# Patient Record
Sex: Female | Born: 1975 | Race: White | Hispanic: No | Marital: Married | Smoking: Current every day smoker
Health system: Southern US, Community
[De-identification: ages and names within clinical notes are randomized; demographics above are authoritative.]

## PROBLEM LIST (undated history)

## (undated) DIAGNOSIS — F419 Anxiety disorder, unspecified: Secondary | ICD-10-CM

## (undated) DIAGNOSIS — D649 Anemia, unspecified: Secondary | ICD-10-CM

## (undated) DIAGNOSIS — O2230 Deep phlebothrombosis in pregnancy, unspecified trimester: Secondary | ICD-10-CM

## (undated) DIAGNOSIS — I2510 Atherosclerotic heart disease of native coronary artery without angina pectoris: Secondary | ICD-10-CM

## (undated) DIAGNOSIS — E119 Type 2 diabetes mellitus without complications: Secondary | ICD-10-CM

## (undated) DIAGNOSIS — J45909 Unspecified asthma, uncomplicated: Secondary | ICD-10-CM

## (undated) DIAGNOSIS — D6851 Activated protein C resistance: Secondary | ICD-10-CM

## (undated) DIAGNOSIS — I10 Essential (primary) hypertension: Secondary | ICD-10-CM

## (undated) HISTORY — DX: Essential (primary) hypertension: I10

## (undated) HISTORY — DX: Atherosclerotic heart disease of native coronary artery without angina pectoris: I25.10

## (undated) HISTORY — DX: Anxiety disorder, unspecified: F41.9

## (undated) HISTORY — DX: Type 2 diabetes mellitus without complications: E11.9

## (undated) HISTORY — DX: Anemia, unspecified: D64.9

---

## 2008-01-09 ENCOUNTER — Ambulatory Visit: Payer: Self-pay | Admitting: Cardiology

## 2008-01-09 ENCOUNTER — Inpatient Hospital Stay (HOSPITAL_COMMUNITY): Admission: EM | Admit: 2008-01-09 | Discharge: 2008-02-04 | Payer: Self-pay | Admitting: Internal Medicine

## 2008-01-09 ENCOUNTER — Ambulatory Visit: Payer: Self-pay | Admitting: Pulmonary Disease

## 2008-01-17 ENCOUNTER — Ambulatory Visit: Payer: Self-pay | Admitting: Thoracic Surgery (Cardiothoracic Vascular Surgery)

## 2008-01-20 ENCOUNTER — Encounter (INDEPENDENT_AMBULATORY_CARE_PROVIDER_SITE_OTHER): Payer: Self-pay | Admitting: Pulmonary Disease

## 2008-02-18 ENCOUNTER — Ambulatory Visit: Payer: Self-pay | Admitting: Internal Medicine

## 2008-02-18 DIAGNOSIS — J13 Pneumonia due to Streptococcus pneumoniae: Secondary | ICD-10-CM | POA: Insufficient documentation

## 2008-02-18 DIAGNOSIS — D649 Anemia, unspecified: Secondary | ICD-10-CM

## 2008-02-18 LAB — CONVERTED CEMR LAB
Basophils Absolute: 0.1 10*3/uL (ref 0.0–0.1)
Eosinophils Absolute: 0.1 10*3/uL (ref 0.0–0.6)
Eosinophils Relative: 1.4 % (ref 0.0–5.0)
MCV: 95.3 fL (ref 78.0–100.0)
Platelets: 560 10*3/uL — ABNORMAL HIGH (ref 150–400)
RBC: 3.83 M/uL — ABNORMAL LOW (ref 3.87–5.11)
RDW: 16.4 % — ABNORMAL HIGH (ref 11.5–14.6)
WBC: 10.4 10*3/uL (ref 4.5–10.5)

## 2008-03-07 ENCOUNTER — Telehealth: Payer: Self-pay | Admitting: Internal Medicine

## 2008-03-09 ENCOUNTER — Ambulatory Visit: Payer: Self-pay | Admitting: Pulmonary Disease

## 2008-03-09 ENCOUNTER — Inpatient Hospital Stay (HOSPITAL_COMMUNITY): Admission: AD | Admit: 2008-03-09 | Discharge: 2008-03-16 | Payer: Self-pay | Admitting: Critical Care Medicine

## 2008-03-10 ENCOUNTER — Ambulatory Visit: Payer: Self-pay | Admitting: Surgery

## 2008-03-10 ENCOUNTER — Encounter: Payer: Self-pay | Admitting: Emergency Medicine

## 2008-04-07 ENCOUNTER — Telehealth (INDEPENDENT_AMBULATORY_CARE_PROVIDER_SITE_OTHER): Payer: Self-pay | Admitting: *Deleted

## 2009-07-28 IMAGING — CT CT CHEST W/O CM
2 of 3 series · 15 of 36 positions shown, 18 images · IV contrast (APPLIED)
Comparison: Chest x-ray of 01/14/08.

CLINICAL DATA: 31-year-old with respiratory failure. 
 CHEST CT WITHOUT CONTRAST:
TECHNIQUE: Multidetector CT imaging of the chest was performed following the standard protocol without IV contrast.

[Series 2: routine chest 5.0 st · axial · 0.59mm/px · z∈[-278,-64]mm · 12 of 51 slices shown, 15 images]
[im 4/51  mediastinal]
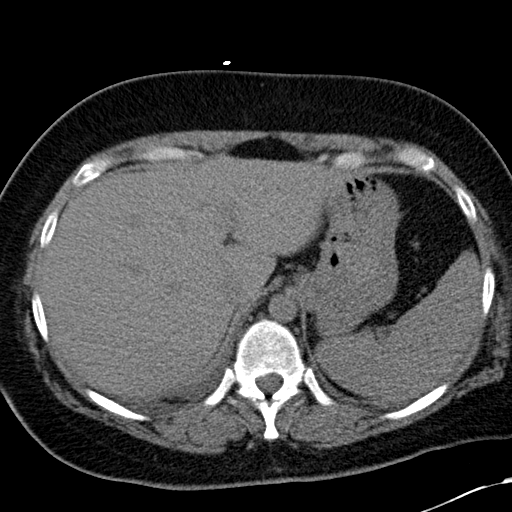
[im 4/51  lung]
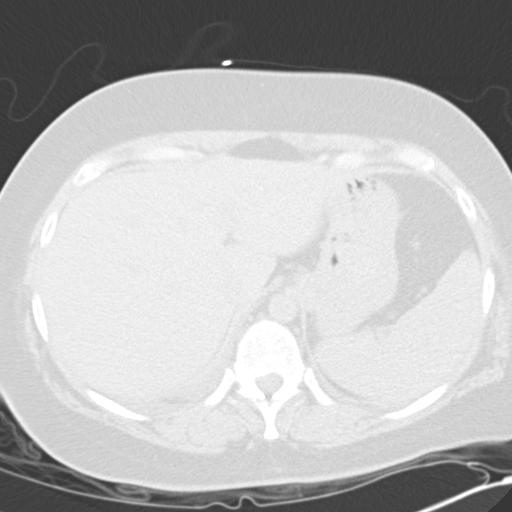
[im 8/51  lung]
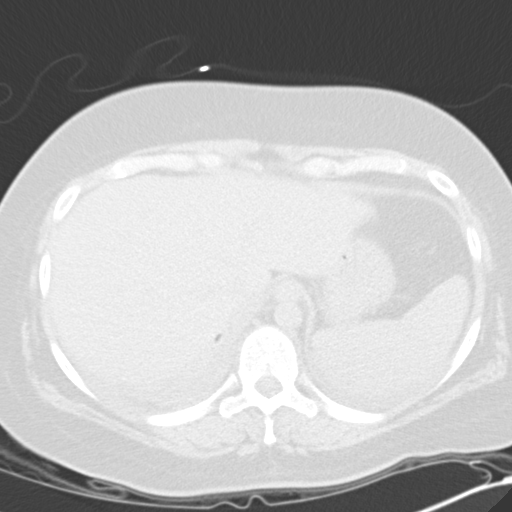
[im 12/51  lung]
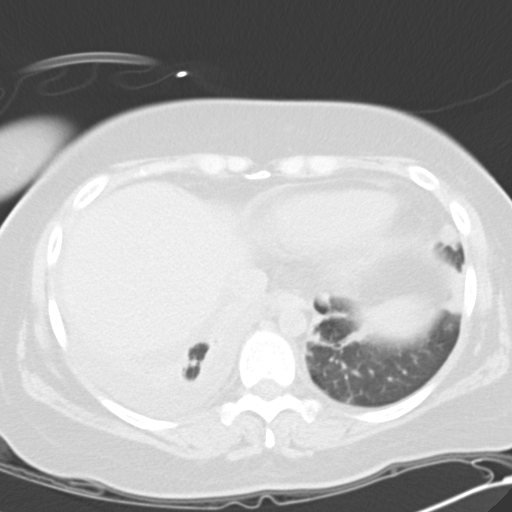
[im 15/51  lung]
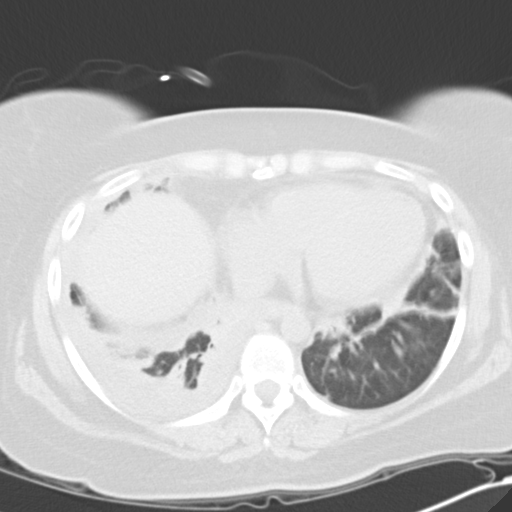
[im 19/51  mediastinal]
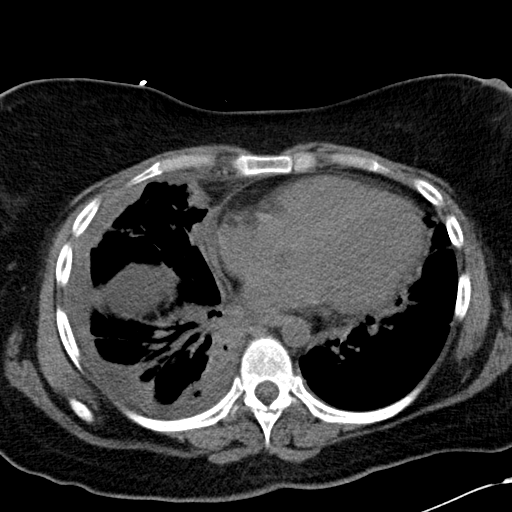
[im 19/51  lung]
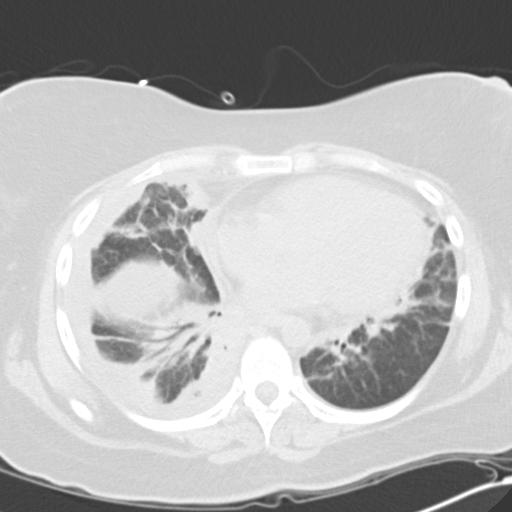
[im 23/51  lung]
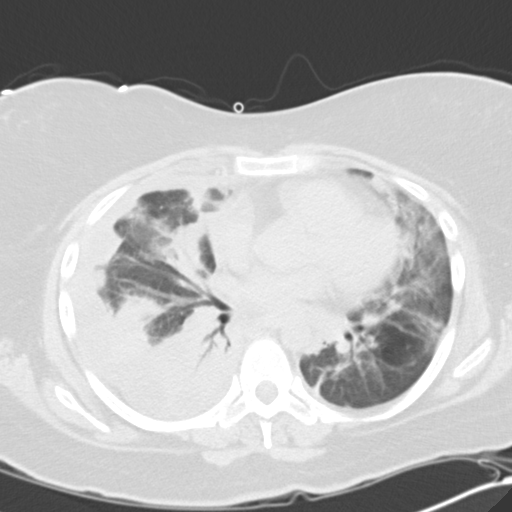
[im 28/51  lung]
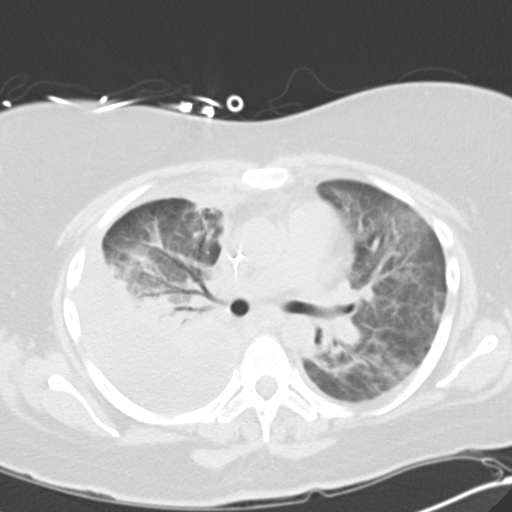
[im 32/51  lung]
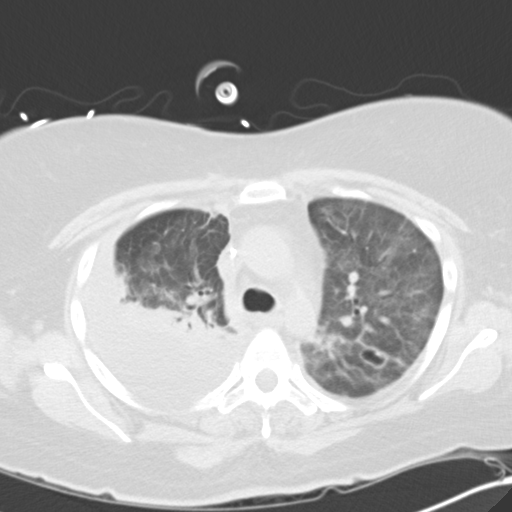
[im 36/51  mediastinal]
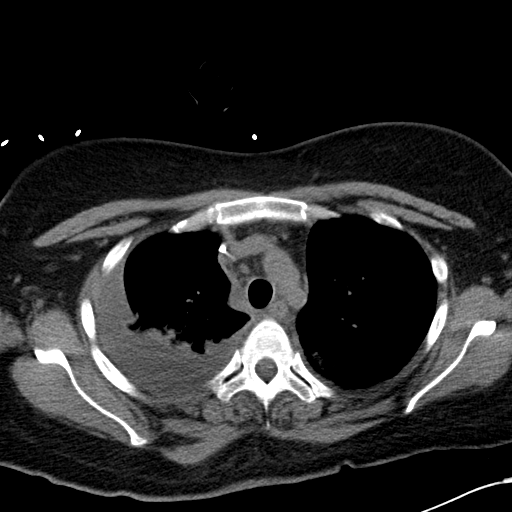
[im 36/51  lung]
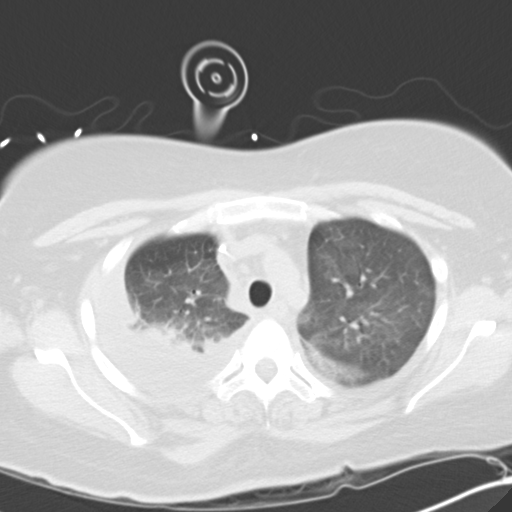
[im 39/51  lung]
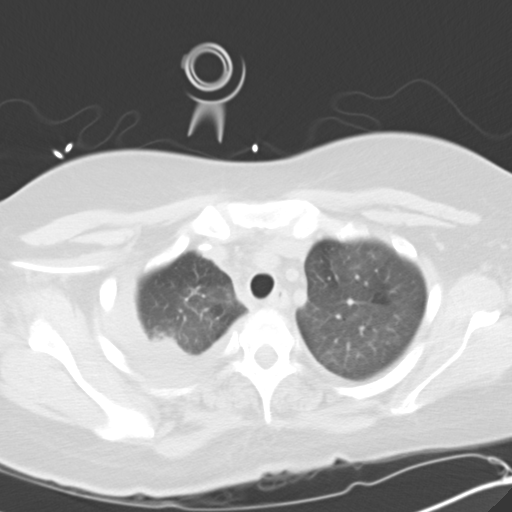
[im 43/51  lung]
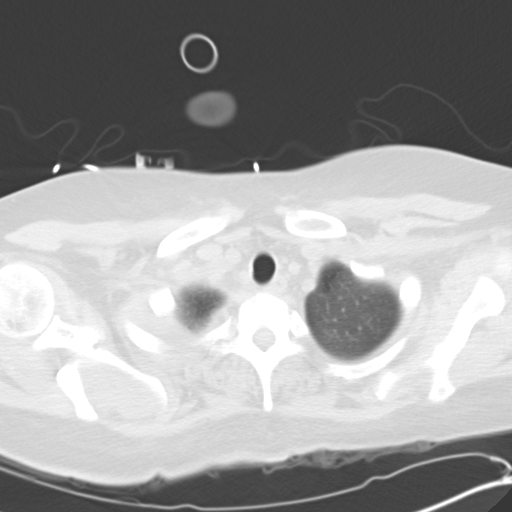
[im 47/51  lung]
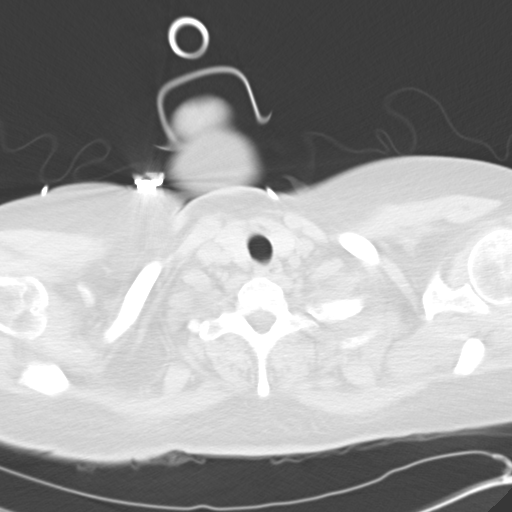

[Series 4: routine chest 2.0 st · coronal · 0.60mm/px · 3 of 101 slices shown]
[im 21/101  lung]
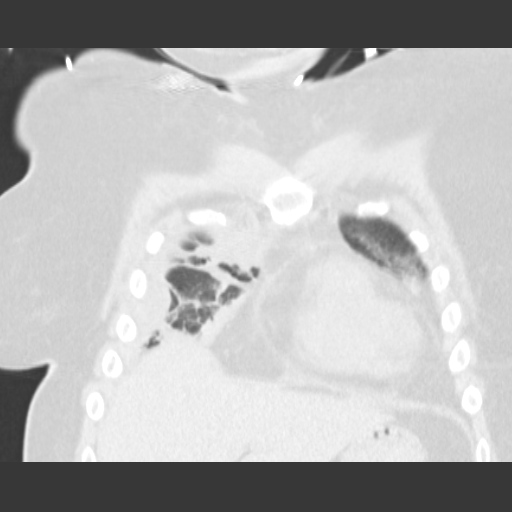
[im 41/101  lung]
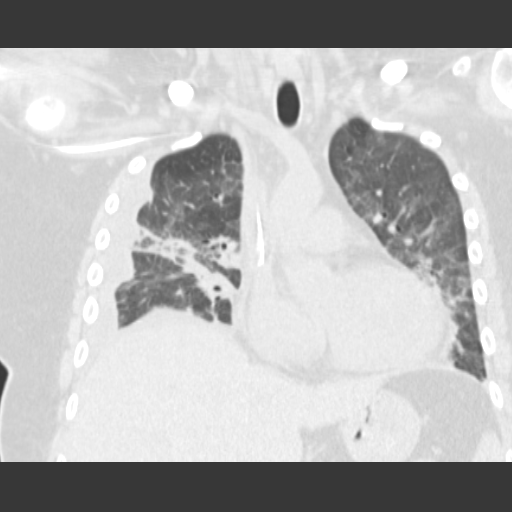
[im 61/101  lung]
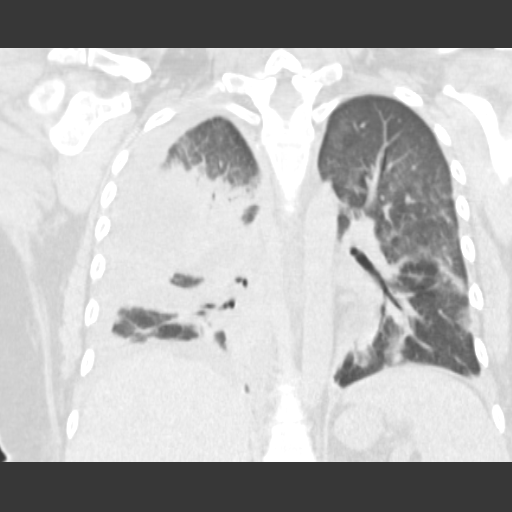

[15 of 36 positions shown; findings below may reference images not displayed]

FINDINGS: The chest wall, soft tissues and bony structures are grossly normal.  No obvious breast masses, supraclavicular or axillary adenopathy.  
 The heart is mildly enlarged.  No pericardial effusion.  Mediastinal and hilar lymph nodes are without adenopathy.  There are right middle and right lower lobe infiltrates with fairly dense airspace consolidation and prominent air bronchograms.  There is also loculated pleural fluid on the right, along with some fluid in the major fissure.  
 There is a small cavitary lesion in the apical posterior segment of the left upper lobe and there is diffuse ground glass opacity, which may reflect edema or alveolitis.  No significant left effusion.  
 The upper abdomen is grossly normal.
IMPRESSION: 1. Mild cardiac enlargement.  No pericardial effusion.  Borderline mediastinal and hilar lymph nodes.
 2. Loculated pleural fluid on the right with fluid in the minor and major fissure.  There are right middle and lower lobe infiltrates.  
 3. Diffuse asymmetric ground glass opacity may reflect edema or alveolitis.  Areas of left lower lobe atelectasis.

## 2009-10-04 ENCOUNTER — Emergency Department (HOSPITAL_COMMUNITY): Admission: EM | Admit: 2009-10-04 | Discharge: 2009-10-05 | Payer: Self-pay | Admitting: Emergency Medicine

## 2009-11-11 ENCOUNTER — Emergency Department (HOSPITAL_COMMUNITY): Admission: EM | Admit: 2009-11-11 | Discharge: 2009-11-12 | Payer: Self-pay | Admitting: Emergency Medicine

## 2010-04-22 ENCOUNTER — Inpatient Hospital Stay (HOSPITAL_COMMUNITY): Admission: EM | Admit: 2010-04-22 | Discharge: 2010-04-24 | Payer: Self-pay | Admitting: Emergency Medicine

## 2010-04-22 ENCOUNTER — Ambulatory Visit: Payer: Self-pay | Admitting: Vascular Surgery

## 2010-04-22 ENCOUNTER — Encounter (INDEPENDENT_AMBULATORY_CARE_PROVIDER_SITE_OTHER): Payer: Self-pay | Admitting: Internal Medicine

## 2010-06-10 ENCOUNTER — Emergency Department (HOSPITAL_COMMUNITY): Admission: EM | Admit: 2010-06-10 | Discharge: 2010-06-10 | Payer: Self-pay | Admitting: Emergency Medicine

## 2010-06-26 ENCOUNTER — Emergency Department (HOSPITAL_BASED_OUTPATIENT_CLINIC_OR_DEPARTMENT_OTHER): Admission: EM | Admit: 2010-06-26 | Discharge: 2010-06-26 | Payer: Self-pay | Admitting: Emergency Medicine

## 2010-07-19 ENCOUNTER — Ambulatory Visit: Payer: Self-pay | Admitting: Oncology

## 2010-08-03 LAB — CBC & DIFF AND RETIC
Basophils Absolute: 0 10*3/uL (ref 0.0–0.1)
Eosinophils Absolute: 0 10*3/uL (ref 0.0–0.5)
HCT: 41.5 % (ref 34.8–46.6)
HGB: 14.2 g/dL (ref 11.6–15.9)
MCV: 92.2 fL (ref 79.5–101.0)
NEUT#: 11.1 10*3/uL — ABNORMAL HIGH (ref 1.5–6.5)
NEUT%: 78.2 % — ABNORMAL HIGH (ref 38.4–76.8)
RDW: 13.3 % (ref 11.2–14.5)
Retic %: 1.11 % (ref 0.50–1.50)
Retic Ct Abs: 49.95 10*3/uL (ref 18.30–72.70)
lymph#: 2.3 10*3/uL (ref 0.9–3.3)

## 2010-08-03 LAB — COMPREHENSIVE METABOLIC PANEL
ALT: 18 U/L (ref 0–35)
AST: 20 U/L (ref 0–37)
Albumin: 3.9 g/dL (ref 3.5–5.2)
Alkaline Phosphatase: 80 U/L (ref 39–117)
Potassium: 2.8 mEq/L — ABNORMAL LOW (ref 3.5–5.3)
Sodium: 143 mEq/L (ref 135–145)
Total Protein: 7.4 g/dL (ref 6.0–8.3)

## 2010-08-04 LAB — FACTOR 8 ASSAY: Coagulation Factor VIII: 167 % — ABNORMAL HIGH (ref 73–140)

## 2011-01-15 ENCOUNTER — Encounter: Payer: Self-pay | Admitting: Family Medicine

## 2011-01-15 ENCOUNTER — Encounter: Payer: Self-pay | Admitting: Thoracic Surgery (Cardiothoracic Vascular Surgery)

## 2011-03-14 LAB — CULTURE, BLOOD (ROUTINE X 2)
Culture: NO GROWTH
Culture: NO GROWTH

## 2011-03-14 LAB — CK TOTAL AND CKMB (NOT AT ARMC)
CK, MB: 6.3 ng/mL (ref 0.3–4.0)
Relative Index: 0.7 (ref 0.0–2.5)
Total CK: 931 U/L — ABNORMAL HIGH (ref 7–177)

## 2011-03-14 LAB — CARDIAC PANEL(CRET KIN+CKTOT+MB+TROPI)
CK, MB: 5.2 ng/mL — ABNORMAL HIGH (ref 0.3–4.0)
CK, MB: 5.8 ng/mL — ABNORMAL HIGH (ref 0.3–4.0)
Relative Index: 0.7 (ref 0.0–2.5)
Relative Index: 0.7 (ref 0.0–2.5)
Total CK: 764 U/L — ABNORMAL HIGH (ref 7–177)
Total CK: 820 U/L — ABNORMAL HIGH (ref 7–177)
Troponin I: 0.01 ng/mL (ref 0.00–0.06)
Troponin I: <0.01 ng/mL (ref 0.00–0.06)

## 2011-03-14 LAB — BASIC METABOLIC PANEL
CO2: 21 mEq/L (ref 19–32)
Chloride: 111 mEq/L (ref 96–112)
Creatinine, Ser: 0.74 mg/dL (ref 0.4–1.2)
GFR calc Af Amer: >60 mL/min (ref >60)
Potassium: 3.1 mEq/L — ABNORMAL LOW (ref 3.5–5.1)
Sodium: 142 mEq/L (ref 135–145)

## 2011-03-14 LAB — PROTEIN S, TOTAL: Protein S Ag, Total: 140 % (ref 70–140)

## 2011-03-14 LAB — PROTEIN C ACTIVITY: Protein C Activity: 135 % — ABNORMAL HIGH (ref 75–133)

## 2011-03-14 LAB — HEMOGLOBIN A1C
Hgb A1c MFr Bld: 6.6 % — ABNORMAL HIGH (ref <5.7)
Mean Plasma Glucose: 143 mg/dL — ABNORMAL HIGH (ref <117)

## 2011-03-14 LAB — POCT I-STAT, CHEM 8
BUN: 4 mg/dL — ABNORMAL LOW (ref 6–23)
Calcium, Ion: 1.16 mmol/L (ref 1.12–1.32)
Chloride: 112 mEq/L (ref 96–112)
Creatinine, Ser: 0.7 mg/dL (ref 0.4–1.2)
Glucose, Bld: 122 mg/dL — ABNORMAL HIGH (ref 70–99)
HCT: 44 % (ref 36.0–46.0)
Hemoglobin: 15 g/dL (ref 12.0–15.0)
Potassium: 3.7 mEq/L (ref 3.5–5.1)
Sodium: 146 meq/L — ABNORMAL HIGH (ref 135–145)
TCO2: 22 mmol/L (ref 0–100)

## 2011-03-14 LAB — BETA-2-GLYCOPROTEIN I ABS, IGG/M/A: Beta-2-Glycoprotein I IgA: 1 A Units (ref <20)

## 2011-03-14 LAB — LUPUS ANTICOAGULANT PANEL: dRVVT Incubated 1:1 Mix: 44.2 secs (ref 36.2–44.3)

## 2011-03-14 LAB — HOMOCYSTEINE: Homocysteine: 13.7 umol/L (ref 4.0–15.4)

## 2011-03-14 LAB — TROPONIN I: Troponin I: <0.01 ng/mL (ref 0.00–0.06)

## 2011-03-14 LAB — CBC
MCV: 97.1 fL (ref 78.0–100.0)
Platelets: 258 10*3/uL (ref 150–400)
WBC: 11.3 10*3/uL — ABNORMAL HIGH (ref 4.0–10.5)

## 2011-03-14 LAB — FACTOR 5 LEIDEN

## 2011-03-14 LAB — CARDIOLIPIN ANTIBODIES, IGG, IGM, IGA: Anticardiolipin IgA: 4 APL U/mL — ABNORMAL LOW (ref <22)

## 2011-03-14 LAB — PROTIME-INR: INR: 1.07 (ref 0.00–1.49)

## 2011-03-14 LAB — PROTEIN S ACTIVITY: Protein S Activity: 150 % — ABNORMAL HIGH (ref 69–129)

## 2011-03-16 ENCOUNTER — Encounter: Payer: Self-pay | Admitting: Cardiovascular Disease

## 2011-03-16 DIAGNOSIS — I2699 Other pulmonary embolism without acute cor pulmonale: Secondary | ICD-10-CM

## 2011-03-17 ENCOUNTER — Encounter: Payer: Self-pay | Admitting: *Deleted

## 2011-03-20 ENCOUNTER — Encounter: Payer: Self-pay | Admitting: *Deleted

## 2011-03-23 ENCOUNTER — Ambulatory Visit: Payer: Self-pay | Admitting: *Deleted

## 2011-03-29 LAB — DIFFERENTIAL
Basophils Relative: 1 % (ref 0–1)
Eosinophils Absolute: 0.1 10*3/uL (ref 0.0–0.7)
Eosinophils Relative: 1 % (ref 0–5)
Lymphs Abs: 2.9 10*3/uL (ref 0.7–4.0)
Monocytes Relative: 5 % (ref 3–12)

## 2011-03-29 LAB — BASIC METABOLIC PANEL
BUN: 4 mg/dL — ABNORMAL LOW (ref 6–23)
GFR calc Af Amer: >60 mL/min (ref >60)
Glucose, Bld: 89 mg/dL (ref 70–99)
Sodium: 141 mEq/L (ref 135–145)

## 2011-03-29 LAB — URINALYSIS, ROUTINE W REFLEX MICROSCOPIC
Ketones, ur: NEGATIVE mg/dL
Nitrite: NEGATIVE
Protein, ur: NEGATIVE mg/dL
pH: 6 (ref 5.0–8.0)

## 2011-03-29 LAB — CBC
HCT: 39.6 % (ref 36.0–46.0)
MCHC: 35 g/dL (ref 30.0–36.0)
MCV: 95.2 fL (ref 78.0–100.0)
RBC: 4.16 MIL/uL (ref 3.87–5.11)
WBC: 13.6 10*3/uL — ABNORMAL HIGH (ref 4.0–10.5)

## 2011-03-29 LAB — WET PREP, GENITAL: Trich, Wet Prep: NONE SEEN

## 2011-03-29 LAB — URINE CULTURE

## 2011-03-29 LAB — GLUCOSE, CAPILLARY: Glucose-Capillary: 82 mg/dL (ref 70–99)

## 2011-03-30 LAB — GLUCOSE, CAPILLARY: Glucose-Capillary: 106 mg/dL — ABNORMAL HIGH (ref 70–99)

## 2011-04-11 ENCOUNTER — Encounter: Payer: Self-pay | Admitting: Cardiovascular Disease

## 2011-04-12 ENCOUNTER — Encounter: Payer: Self-pay | Admitting: Cardiovascular Disease

## 2011-04-12 ENCOUNTER — Ambulatory Visit (INDEPENDENT_AMBULATORY_CARE_PROVIDER_SITE_OTHER): Payer: Medicaid Other | Admitting: Cardiovascular Disease

## 2011-04-12 ENCOUNTER — Ambulatory Visit (INDEPENDENT_AMBULATORY_CARE_PROVIDER_SITE_OTHER): Payer: Medicaid Other | Admitting: *Deleted

## 2011-04-12 DIAGNOSIS — F319 Bipolar disorder, unspecified: Secondary | ICD-10-CM

## 2011-04-12 DIAGNOSIS — I2699 Other pulmonary embolism without acute cor pulmonale: Secondary | ICD-10-CM

## 2011-04-12 DIAGNOSIS — R0600 Dyspnea, unspecified: Secondary | ICD-10-CM

## 2011-04-12 DIAGNOSIS — R0609 Other forms of dyspnea: Secondary | ICD-10-CM

## 2011-04-12 DIAGNOSIS — I119 Hypertensive heart disease without heart failure: Secondary | ICD-10-CM

## 2011-04-12 DIAGNOSIS — F17200 Nicotine dependence, unspecified, uncomplicated: Secondary | ICD-10-CM

## 2011-04-12 DIAGNOSIS — I10 Essential (primary) hypertension: Secondary | ICD-10-CM

## 2011-04-12 DIAGNOSIS — Z7901 Long term (current) use of anticoagulants: Secondary | ICD-10-CM

## 2011-04-12 DIAGNOSIS — Z8679 Personal history of other diseases of the circulatory system: Secondary | ICD-10-CM

## 2011-04-12 DIAGNOSIS — Z5181 Encounter for therapeutic drug level monitoring: Secondary | ICD-10-CM

## 2011-04-12 DIAGNOSIS — R9431 Abnormal electrocardiogram [ECG] [EKG]: Secondary | ICD-10-CM

## 2011-04-12 LAB — POCT INR: INR: 1.9

## 2011-04-12 NOTE — Assessment & Plan Note (Signed)
No documented evidence of CE.  Needs F/U echo.  Dyspnea seems functional.

## 2011-04-12 NOTE — Progress Notes (Signed)
35 yo poor historian with bipolar disease.  Referred by Dr Lovell Sheehan for ? Cardiomegaly, dsypnea and abnormal ECG.  Patient hospitalized in 2011 with pneumonia needing intubation and VATS.  Still smoking.  Counseled for less than 10 minutes regarding cessation but she indicates she cant due to the stress of life.  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  Apparantly labs drawn via phlebotomy in hometown and faxed to primary.  Suboptimal control.  No documentation of hypercoagulable w/u.  Dyspnea chronic likely from lung disease.  No history of CHF  Echo 12/2007 at [facility] normal LV size no LVH and EF 65%.  Long history of HTN now on Rx.  Sedentary at home.  Cares for two kids and ADL's but no much else. Bipolar disease limits functioning.  She has an appt with our coumadin clinic to see why her levels are not easily controlled.  She does not want Pradaxa she is scared of it.    ROS: Denies fever, malais, weight loss, blurry vision, decreased visual acuity, cough, sputum, SOB, hemoptysis, pleuritic pain, palpitaitons, heartburn, abdominal pain, melena, lower extremity edema, claudication, or rash.   General: Affect appropriate Healthy:  appears stated age HEENT: normal Neck supple with no adenopathy JVP normal no bruits no thyromegaly Lungs clear with no wheezing and good diaphragmatic motion Heart:  S1/S2 no murmur,rub, gallop or click PMI normal Abdomen: benighn, BS positve, no tenderness, no AAA no bruit.  No HSM or HJR Distal pulses intact with no bruits No edema Neuro non-focal Skin warm and dry No muscular weakness  Medications Current Outpatient Prescriptions  Medication Sig Dispense Refill  . ALPRAZolam (XANAX) 1 MG tablet Take 0.5 mg by mouth 4 (four) times daily.        Marland Kitchen buPROPion (WELLBUTRIN SR) 150 MG 12 hr tablet Take 150 mg by mouth daily.        Marland Kitchen lisinopril (PRINIVIL,ZESTRIL) 20 MG tablet Take 20 mg by mouth daily.        Marland Kitchen warfarin (JANTOVEN) 5 MG tablet Take 5 mg by mouth  daily.        Marland Kitchen DISCONTD: metformin (FORTAMET) 500 MG (OSM) 24 hr tablet Take 500 mg by mouth daily with breakfast.        . DISCONTD: oxyCODONE-acetaminophen (PERCOCET) 10-325 MG per tablet Take 1 tablet by mouth every 4 (four) hours as needed.        Marland Kitchen DISCONTD: warfarin (COUMADIN) 5 MG tablet as directed.          Allergies Review of patient's allergies indicates no known allergies.  Family History: No family history on file. Positive for prematue CAD in brother and mother  Social History: History   Social History  . Marital Status: Married    Spouse Name: N/A    Number of Children: N/A  . Years of Education: N/A   Occupational History  . Not on file.   Social History Main Topics  . Smoking status: Current Everyday Smoker -- 1.8 packs/day  . Smokeless tobacco: Not on file  . Alcohol Use: Not on file  . Drug Use: Not on file  . Sexually Active: Not on file   Other Topics Concern  . Not on file   Social History Narrative  . No narrative on file  Married Two children On disability Sedentary Smokes 1ppd No ETOH Primary is Lovell Sheehan in [place]  Electrocardiogram:  NSR 111 LAD poor R wave progression  Assessment and Plan

## 2011-04-12 NOTE — Assessment & Plan Note (Signed)
Well controlled.  Continue current medications and low sodium Dash type diet.    

## 2011-04-12 NOTE — Patient Instructions (Signed)
Your physician has requested that you have an echocardiogram. Echocardiography is a painless test that uses sound waves to create images of your heart. It provides your doctor with information about the size and shape of your heart and how well your heart's chambers and valves are working. This procedure takes approximately one hour. There are no restrictions for this procedure.  Your physician recommends that you schedule a follow-up appointment in:  As needed with Dr. Eden Emms

## 2011-04-12 NOTE — Assessment & Plan Note (Signed)
History of VATS and ? PE with DVT;s.  Normal exam.  Echo to assess RV and LV funciton

## 2011-04-12 NOTE — Assessment & Plan Note (Signed)
Discussed with Kennon Rounds.  This is not a patient we can follow for coumadin  She will not be followed by [place] regularly.  She has already been noncompliant with first coumadin visit and she lives far away.  Clinic to contact primary.  I am sure her INR;s are subRx due to noncompliance complicated by her bipolar disease and fear of blood thinners

## 2011-04-12 NOTE — Assessment & Plan Note (Signed)
Counseled for less than 10 minutes.  No motivation to quit.  Long term risks regarding recurrent pneumonia, CA and dyspnea discussed

## 2011-04-21 ENCOUNTER — Other Ambulatory Visit (HOSPITAL_COMMUNITY): Payer: Medicaid Other | Admitting: Radiology

## 2011-05-08 ENCOUNTER — Other Ambulatory Visit (HOSPITAL_COMMUNITY): Payer: Medicaid Other

## 2011-05-09 NOTE — Consult Note (Signed)
Shannon [name], [name] NO.:  [id number]   MEDICAL RECORD NO.:  [id number]          PATIENT TYPE:  INP   LOCATION:  2907                         FACILITY:  [facility]   PHYSICIAN:  Salvatore Decent. Cornelius Moras, M.D. DATE OF BIRTH:  [birth year]   DATE OF CONSULTATION:  01/16/2008  DATE OF DISCHARGE:                                 CONSULTATION   REASON FOR CONSULTATION:  Pneumonia with loculated right pleural  effusion, possible empyema.   REQUESTING PHYSICIAN:  Felipa Evener, MD.   HISTORY OF PRESENT ILLNESS:  Shannon [name] is a 35 year old old obese  female with history of diabetes mellitus, who initially presented nearly  2 weeks ago with fever, cough and shortness of breath to her primary  care physician.  A chest x-ray was worrisome for right-sided pulmonary  infiltrate and she developed worsening respiratory failure, for which  she was transferred to [facility].  There she developed  ventilator-dependent respiratory failure, for which she was intubated on  January 12.  Cultures apparently were positive for influenza and she was  treated with Xigris, intravenous antibiotics and steroids.  She was  ultimately transferred to [facility] on January 16 for  further management and therapy.  She has since then been successfully  weaned and extubated from the ventilator.  Her white blood count has  normalized.  She has clinically slowly improved.  However, chest x-ray  and subsequent CT scan demonstrate large loculated right pleural  effusion.  Yesterday an attempted needle thoracentesis was performed,  but only a small amount of thick fluid could be aspirated.  Thoracic  surgical consultation was requested.   REVIEW OF SYSTEMS:  The patient reports she still gets short of breath  with activity and she is noted to desaturate with activity, requiring  oxygen therapy.  Her cough has resolved.  She has not had fevers for  more than 48 hours.  She has  been eating for the last day or so.  She  has not had any right-sided chest pain.  The remainder of her review of  systems is unrevealing.   PAST MEDICAL HISTORY:  1. History of diabetes mellitus.  2. Obesity.  3. Asthma.  4. Chronic bronchitis.  5. Chronic pain syndrome with degenerative disk disease of the lumbar      spine.  6. Hypertension.   PAST SURGICAL HISTORY:  None.   MEDICATIONS PRIOR TO ADMISSION:  Lisinopril, Motrin, Percocet, and  Xanax.   DRUG ALLERGIES:  None known.   SOCIAL HISTORY:  The patient is married and lives in [place], [place].  She smokes between 1-1/2 and 2 packs of cigarettes per day.   PHYSICAL EXAM:  Notable for an obese female who appears her stated age,  in no acute distress.  She has been afebrile.  Blood pressure is stable and she is in sinus  rhythm.  Oxygen saturations are currently 96% on 2 L nasal cannula.  HEENT:  Unrevealing.  There is no palpable lymphadenopathy in the cervical or supraclavicular  locations.  Auscultation of the chest reveals diminished breath sounds on the  right  side.  No wheezes or rhonchi are noted.  CARDIOVASCULAR:  Exam notable for regular rate and rhythm.  No murmurs,  rubs or gallops noted.  ABDOMEN:  Obese but soft, nontender.  EXTREMITIES:  Warm and well-perfused.  There is mild lower extremity  edema.  RECTAL:  Deferred.  GU:  Deferred.  NEUROLOGIC:  Grossly nonfocal and symmetrical throughout.   DIAGNOSTIC TESTS:  Chest CT scan performed January 14, 2008, is  reviewed.  This demonstrates right middle and right lower lobe pulmonary  infiltrate with fairly dense air space consolidation consistent with  pneumonia.  There is a moderate to large-sized loculated right pleural  effusion.   IMPRESSION:  Community-acquired pneumonia on the right side, complicated  by a loculated right pleural effusion and possible empyema.  Attempts at  bedside thoracentesis have been unhelpful.  I believe that [name]  would best be treated with surgical drainage of her loculated right  pleural effusion and probable empyema.   PLAN:  I have discussed options with Shannon [name] and her husband.  Alternative treatment strategies have been discussed.  We tentatively  plan to proceed with right video-assisted thoracoscopy for drainage of  her right pleural effusion and possible empyema tomorrow.  They  understand and accept all associated risks of surgery including but not  limited to risk of death, myocardial infarction, pneumonia, respiratory  failure, bleeding requiring blood transfusion, arrhythmia, prolonged  chest wall pain, possible need for open thoracotomy.  All of their  questions have been addressed.      Salvatore Decent. Cornelius Moras, M.D.  Electronically Signed     CHO/MEDQ  D:  01/16/2008  T:  01/17/2008  Job:  [id number]   cc:   Felipa Evener, MD

## 2011-05-09 NOTE — H&P (Signed)
NAMECHERESA, SIERS NO.:  [id number]   MEDICAL RECORD NO.:  [id number]          PATIENT TYPE:  INP   LOCATION:  2102                         FACILITY:  [facility]   PHYSICIAN:  Felipa Evener, MD  DATE OF BIRTH:  16-Mar-1976   DATE OF ADMISSION:  03/09/2008  DATE OF DISCHARGE:                              HISTORY & PHYSICAL   IDENTIFICATION:  The patient is a 35 year old female with a past medical  history significant for severe smoking history with a 60 pack-year  history of smoking in a 35 year old that continues to smoke, was  admitted to the hospital approximately 6 weeks ago with a diagnosis of  pneumonia, had a period of septic shock that required intubation for  respiratory insufficiency.  The patient developed an empyema at that  time and was taken to the operating room for a VATS and decortication,  after which the patient stabilized and the septic shock resolved,  however, developed pneumonia on the other side and a similar picture  appeared, except the patient did not need to go to the operating room.  The patient was discharged home and went to followup today with her  primary care physician with a chief complaint of left leg swelling and  pain.  The patient has been having pain for approximately the past 4-5  days and the pain is in the back of her thigh.  She placed a heating pad  on and there now there is evidence of a burn to that area.  This pain is  also accompanied by swelling on the left side pitting edema which has  been present for approximately the past week.  The patient has had no shortness of breath.  She does report some fever  with chills with a temperature of 102.2 but otherwise has no further  complaint.   PAST MEDICAL HISTORY:  1. Smoking as mentioned above.  2. Empyema and hospitalization for this as mentioned above.  3. History of diabetes controlled by p.o. medications.  Otherwise no significant past medical history.   PAST  SURGICAL HISTORY:  VATS.   HOME MEDICATIONS:  1. Metformin 500 mg p.o. b.i.d.  2. Xanax 1 mg, a half tablet four times per day.  3. Percocet 10/325, one tablet every six hours on a p.r.n. basis.   ALLERGIES:  No known drug allergies.   FAMILY HISTORY:  Noncontributory.   SOCIAL HISTORY:  The patient smoked 2-3 packs per day since she was 35  years old and continues to smoke.  No alcohol.  No drug abuse.  No  significant occupational history.   REVIEW OF SYSTEMS:  A 12-point review of systems was performed and all  negative other than the above.   PHYSICAL EXAMINATION:  GENERAL: This is a well-appearing female resting  comfortably in bed in no acute distress.  VITAL SIGNS:  Temperature 97.5, heart rate is 109, respiratory rate is  20, and blood pressure of 148/110.  Oxygen saturation 100% on room air.  HEENT:  Normocephalic atraumatic.  Pupils are equal, round, and reactive  to light.  Extraocular movements are intact.  The  oral and nasal mucosa  within normal limits.  NECK:  No thyromegalyor lymphadenopathy.  No jugular venous distention  appreciated.  __________.  HEART:  Regular rate and rhythm.  Normal S1 S2.  No murmurs, rubs, or  gallops appreciated.  LUNGS:  Decreased breath sounds and bibasilar crackles.  ABDOMEN:  Soft, nontender, nondistended.  Positive bowel sounds.  EXTREMITIES:  The right leg is normal with no edema and no tenderness.  The left leg is with significant pitting edema up to her mid thigh with  an area of erythema and some vesicular changes on the posterior aspect  of her leg.  There is a cord sign and negative Homan sign and tenderness  diffusely.  NEUROLOGIC:  Grossly intact.   LABORATORY STUDIES:  All pending at this point.   ASSESSMENT:  The patient is a 35 year old female with a past medical  history significant for pneumonia with an empyema as well as significant  history of smoking presenting with a deep vein thrombosis on the left  side as  well as potential cellulitis and evidence of a burn as the  patient has been placing a heating pad on that area and the pattern is  consistent with a burn pattern.   PLAN:  Therefore, at this time since we do not have imaging of that area  and the extent of the clot, we will perform bilateral lower extremity  Dopplers prior to sending her for a venogram.  We will start a heparin  drip, however, before starting the heparin drip, would send a  hypercoagulable workup.  We will consult IR in the morning depending on  the extent of the DVT.  __________ and vancomycin to be sent as well as  panculture.  Chest x-ray will be ordered and the patient will be  admitted to the intensive care unit in 2102.      Felipa Evener, MD  Electronically Signed     WJY/MEDQ  D:  03/09/2008  T:  03/09/2008  Job:  [id number]

## 2011-05-09 NOTE — Discharge Summary (Signed)
NAMECHANNIE, BOSTICK NO.:  [id number]   MEDICAL RECORD NO.:  [id number]          PATIENT TYPE:  INP   LOCATION:  6735                         FACILITY:  [facility]   PHYSICIAN:  Devra Dopp, MSN, ACNP DATE OF BIRTH:  Jan 04, 1976   DATE OF ADMISSION:  01/09/2008  DATE OF DISCHARGE:                               DISCHARGE SUMMARY   No dictation.      Devra Dopp, MSN, ACNP     SM/MEDQ  D:  02/04/2008  T:  02/04/2008  Job:  [id number]

## 2011-05-09 NOTE — Op Note (Signed)
Shannon [name], Shannon [name]            ACCOUNT NO.:  [id number]   MEDICAL RECORD NO.:  [id number]          PATIENT TYPE:  INP   LOCATION:  2550                         FACILITY:  [facility]   PHYSICIAN:  Salvatore Decent. Cornelius Moras, M.D. DATE OF BIRTH:  24-Oct-1976   DATE OF PROCEDURE:  01/17/2008  DATE OF DISCHARGE:                               OPERATIVE REPORT   PREOPERATIVE DIAGNOSIS:  Community-acquired pneumonia with loculated  right parapneumonic effusion, possible empyema.   POSTOPERATIVE DIAGNOSIS:  Community-acquired pneumonia with loculated  right parapneumonic effusion, possible empyema.   PROCEDURE:  Right video-assisted thoracoscopy for drainage of loculated  pleural effusion and empyema.   SURGEON:  Dr. Purcell Nails.   ASSISTANT:  Stephanie Acre. [name], [name]   ANESTHESIA:  General.   CLINICAL NOTE:  The patient is a 35 year old female who presents with  community acquired pneumonia complicated by respiratory failure.  The  patient was initially treated at [facility] and ultimately  transferred to [facility].  She was successfully  weaned from the ventilator and extubated.  Followup chest CT scan  demonstrates moderate size of loculated right pleural effusion with  dense parenchymal opacification consistent with pneumonia.  Attempts at  bedside thoracentesis are unsuccessful.  A full consultation note has  been dictated previously.  The patient has been counseled regarding the  indications, risks, potential benefits of surgical intervention for more  definitive drainage of her loculated right pleural effusion.  Alternative treatment strategies have been discussed.  She desires to  proceed as described.   DESCRIPTION OF PROCEDURE:  The patient was brought to the operating room  on the above-mentioned date and placed in the supine position on the  operating table.  Intravenous antibiotics are administered.  A central  venous catheter and radial arterial line  are placed for monitoring  purposes.  General endotracheal anesthesia is induced uneventfully under  the care and direction Dr. Sharee Holster.  A Foley catheter is placed.  The patient is turned to the left lateral decubitus position using  pneumatic beanbag device and axillary roll to facilitate positioning.  The patient's right chest is prepared and draped in sterile manner.  Single lung ventilation is begun.   A small incision is made overlying the anterior axillary line in  approximately the sixth intercostal space.  The subcutaneous tissues and  intercostal musculature are divided with electrocautery.  The right  pleural space is entered bluntly.  There is evidence for acute  inflammation with adhesions surrounding the lung and bloody pleural  fluid.  The adhesions are mobilized using digital exploration and a  sucker tip.  A moderate amount of bloody pleural fluid is evacuated, and  the specimen is sent for routine culture and sensitivity.  A second  incision is made more posteriorly through the fifth intercostal space.  Through these incisions, further maneuvers are performed to break up as  much of the loculations as possible and drain the majority of the  pleural fluid.  The lung is fairly adherent to the chest wall,  particularly posteriorly and inferiorly, and formal decortication is  felt not to be  necessary nor indicated based upon preoperative CT scan.  Once the loculations have been broken up as much as possible, a 10-mm  port is passed through the incision and video thoracoscopic camera is  passed through the port.  Visualization is minimal due to the bloody  effusion and the amount of loculations.  The chest is irrigated with  saline solution.  A single 28-French chest tube is passed through the  original incision, and the remaining port incision is closed in multiple  layers in routine fashion.  The chest tube is fixed to a closed suction  drainage device.   The  patient tolerated the procedure well, was extubated in the operating  room and transported to the recovery room in stable condition.  There  were no intraoperative complications.      Salvatore Decent. Cornelius Moras, M.D.  Electronically Signed     CHO/MEDQ  D:  01/17/2008  T:  01/17/2008  Job:  [id number]

## 2011-05-09 NOTE — Discharge Summary (Signed)
NAMEARIYANNA, OIEN NO.:  [id number]   MEDICAL RECORD NO.:  [id number]          PATIENT TYPE:  INP   LOCATION:  5114                         FACILITY:  [facility]   PHYSICIAN:  Felipa Evener, MD  DATE OF BIRTH:  1976-07-26   DATE OF ADMISSION:  03/09/2008  DATE OF DISCHARGE:  03/16/2008                               DISCHARGE SUMMARY   FINAL DIAGNOSIS:  Left lower extremity deep vein thrombosis.   CONSULTANTS:  Dr. Richarda Overlie with interventional radiology.   LABORATORY DATA:  Date, March 12, 2008, PT/INR 17.4/1.4.  Date, March 13, 2008, PT/INR 20.7/1.7.  Date, March 14, 2008, PT/INR 26.5/2.4.  Date, March 15, 2008, PT/INR 27.2/2.4.  Date, March 16, 2008, PT/INR  24.8/2.2.  Dosing of Coumadin during these times were on March 11, 2008,  she had received 7.5 mg; then on March 12, 2008, she received 7.5 mg;  and on March 13, 2008, she received 7.5 mg of Coumadin.  Coumadin was  held on March 14, 2008, resumed on March 15, 2008 with 3 mg.  Date,  March 16, 2008, white blood cell count 7.5, hemoglobin 10.2, hematocrit  30.2, and platelet count 475.   VASCULAR LABS:  On March 10, 2008, left DVT noted in the common femoral  vein, saphenofemoral junction, profunda vein, femoral vein, popliteal  vein, and gastrocnemius vein.  No occlusive DVT noted in the posterior  tibial vein.  The greater saphenous vein was patent with the exception  of the SFJ.   HOSPITAL COURSE BY DISCHARGE DIAGNOSIS:  Deep vein thrombosis.   HISTORY OF PRESENT ILLNESS:  This is a 35 year old female with past  medical history for significant smoking, polysubstance abuse, and recent  pneumonia secondary to pneumococcal infection with resultant empyema,  requiring VAT, thoracotomy.  She was hospitalized for several weeks, was  discharged to home and developed left leg swelling and pain.  She was  seen by her primary care physician with a report of 4-5 days of pain and  swelling.  She is referred  to come in for further evaluation and  therapy.   HOSPITAL COURSE BY DISCHARGE DIAGNOSIS:  Ms. Merida was seen and  admitted on March 10, 2008.  Ultrasound results noted above.  Radiology  was consulted for consideration for thrombolysis, which the patient  turned down.  The patient had discussed risks and benefits with the  physician and decided against this, in favor of long-term  anticoagulation.  She was initiated on heparin drip, as well as Coumadin  dosing.  She underwent 3 days of double therapeutic coverage on heparin  drip and therapeutic Coumadin.  She will now be discharged to home oral  warfarin maintenance and is scheduled to follow up with her primary care  physician for further Coumadin dosing.   DISCHARGE INSTRUCTIONS:  1. Ziac 2.5/6.25 once daily.  2. Lisinopril 20 mg twice a day.  3. Metformin 500 mg twice a day.  4. Xanax 1 mg p.r.n. 4 times a day.  5. Percocet 1 tab every 6 hours p.r.n. pain.  Additionally she was instructed to take warfarin 2.5-mg tabs.  She will  take 2 tabs each night as directed, then followup with Dr. Leonette Monarch on  Thursday, March 19, 2008, at 10:30 a.m. for further warfarin dosing  based on  PT/INR.  Of note, Ms. Catalina has asked for further narcotic  adjustments.  [facility] has deferred  her analgesic regimen to her primary care Kupono Marling.  As we are primarily  an inpatient service, we will defer prior narcotic dosing to the  providers who know Ms. Ida best.      Zenia Resides, NP      Felipa Evener, MD  Electronically Signed    PB/MEDQ  D:  03/16/2008  T:  03/17/2008  Job:  [id number]   cc:   Lillia Dallas, MD

## 2011-05-09 NOTE — Discharge Summary (Signed)
Shannon [name], Shannon [name]            ACCOUNT NO.:  [id number]   MEDICAL RECORD NO.:  [id number]          PATIENT TYPE:  INP   LOCATION:  6735                         FACILITY:  [facility]   PHYSICIAN:  Dr. Shelva Majestic               DATE OF BIRTH:  Mar 10, 1976   DATE OF ADMISSION:  01/09/2008  DATE OF DISCHARGE:  02/04/2008                               DISCHARGE SUMMARY   DISCHARGE DIAGNOSES:  1. Community acquired pneumonia and acute respiratory failure.  2. Shock.  3. Hypokalemia and hypomagnesemia.  4. Anemia.  5. Diabetes mellitus.   HISTORY OF PRESENT ILLNESS:  The patient is a 35 year old white female  with a past medical history of anxiety disorder, tobacco abuse, chronic  pain, hypertension, and diabetes, who presented to [facility] with a few days of malaise, dyspnea, and fever.  She was in  acute respiratory distress and required orotracheal intubation and was  transferred to the [facility].  She was positive for  influenza and treated with Xigris, antibiotics, and steroid and has  remained intubated.  At her family's request, she has been transferred  to [facility] on January 09, 2008.   LABORATORY DATA:  Culture done on January 10, 2008, shows a few candida.  Pleural fluid showed no growth.  Urine culture showed multiple species.  Hemoglobin 9.0, hematocrit 27.0, platelets 357, WBC 5.6.  Sodium 138,  potassium 4.0, chloride 107, CO2 27, BUN 3, creatinine 0.61, glucose 81.  AST 14, ALT 18, alkaline phosphatase 105, total bilirubin 0.5, albumin  1.5.  Calcium 7.8.  Clostridium difficile stool was negative.   Chest x-ray on January 30, 2008, demonstrates left PICC line in good  position with stable x-ray.  CT of the chest on February 2, status post  thoracostomy with marked reduction of amount of loculated pleural gas  and air, improved patchy atelectasis versus air space disease throughout  the right lung.  Otherwise stable examination.   PROCEDURE:  She had endotracheal tube placement from January 06, 2008,  to January 12, 2008.  She had a right PICC which remains in place and  will be removed prior to discharge.  On January 23, she had a right VATS  with chest tube insertion.  On January 30, she had a pigtail catheter  placed by interventional radiology with subsequent removal of February  2.  Her antibiotics have consisted of Vancomycin from January 16 to  January 16, Rocephin from January 16 to January 23, __________  from  January 16 to January 24, Avelox from January 24 until complete,  ranitidine from January 16 to January 23.  The two-dimensional  echocardiogram showed an EF of 65%.  Doppler of right arm was negative  for DVT.  Note that further antimicrobials consistent of Primaxin and  Zyvox which have been completed.   HOSPITAL COURSE:  Problem 1.  Ventilator dependent respiratory failure  secondary to community acquired pneumonia.  This is considered viral  with empyema and acute respiratory failure.  She is transferred to [facility] at the request  of the family.  She is treated with  antimicrobial therapy and antiviral therapy.  She underwent VATS for  right empyema on January 23 per Dr. Cornelius Moras.  She has completed antibiotics  of Zosyn along with Zyvox and Primaxin.  She also had a pigtail placed  for further drainage which has been removed.  She has reached maximal  hospital benefit and will be followed up by cardiovascular thoracic  surgeon in six weeks with a CT of the chest with contrast.  She will  also be followed up within two weeks with pulmonary critical care and  follow up with Dr. Leonette Monarch her primary care physician in [place], [place].   Problem 2.  Shock of unclear etiology, most likely sepsis.  She was  pressor dependent at one time and she was treated with IV fluid  resuscitation.  This has resolved.   Problem 3.  Hypokalemia and hypomagnesemia which have been repleted and  are normal.    Problem 4.  Anemia which has been treated.  Hemoglobin has now remained  stable.   Problem 5.  Community acquired pneumonia with bacteremia.  This is felt  to be a Pneumococcal pneumonia with bacteremia and empyema.  She is  status post VATS and it has now resolved.   Problem 6.  Diabetes mellitus.  She will remain on her Metformin.   Problem 7.  She has swollen right upper extremity but it was negative  for DVT.  A right PICC was discontinued and a left one was placed and  that will be discontinued today.   DISCHARGE MEDICATIONS:  1. Metformin 500 mg b.i.d.  2. Xanax 1 mg 1/2 tablet four times a day.  3. Percocet 10/325 mg one tablet every six hours p.r.n. as needed.   She has been left off her other medications until she sees her primary  care physician.   DIET:  Low sodium, heart healthy, no concentrated sweets.   FOLLOWUP:  Dr. Leonette Monarch in the near future.  Kalman Shan, M.D. of  pulmonary critical care on February 23, at 2:10 p.m.  Dr. Cornelius Moras on March  23 at 1:15 p.m.   CONDITION ON DISCHARGE:  Improved.      Devra Dopp, MSN, ACNP    ______________________________  Dr. Shelva Majestic    SM/MEDQ  D:  02/04/2008  T:  02/04/2008  Job:  [id number]   cc:   Salvatore Decent. Cornelius Moras, M.D.  Dr. Leonette Monarch

## 2011-09-15 LAB — CBC
HCT: 18.9 — ABNORMAL LOW
HCT: 20.6 — ABNORMAL LOW
HCT: 22.3 — ABNORMAL LOW
HCT: 23.4 — ABNORMAL LOW
HCT: 26.1 — ABNORMAL LOW
HCT: 26.5 — ABNORMAL LOW
HCT: 29.1 — ABNORMAL LOW
HCT: 25.2 — ABNORMAL LOW
HCT: 26 — ABNORMAL LOW
HCT: 27 — ABNORMAL LOW
Hemoglobin: 11.9 — ABNORMAL LOW
Hemoglobin: 7 — CL
Hemoglobin: 7.5 — CL
Hemoglobin: 8 — ABNORMAL LOW
Hemoglobin: 8.6 — ABNORMAL LOW
Hemoglobin: 9 — ABNORMAL LOW
Hemoglobin: 9 — ABNORMAL LOW
Hemoglobin: 9.4 — ABNORMAL LOW
Hemoglobin: 9.5 — ABNORMAL LOW
Hemoglobin: 9.5 — ABNORMAL LOW
Hemoglobin: 9.7 — ABNORMAL LOW
Hemoglobin: 9.8 — ABNORMAL LOW
Hemoglobin: 8.6 — ABNORMAL LOW
Hemoglobin: 9 — ABNORMAL LOW
MCHC: 34.1
MCHC: 34.2
MCHC: 34.5
MCV: 94.5
MCV: 95.1
MCV: 95.3
MCV: 95.3
MCV: 95.8
MCV: 96.2
MCV: 96.8
Platelets: 166
Platelets: 170
Platelets: 188
Platelets: 192
Platelets: 214
Platelets: 246
Platelets: 273
Platelets: 179
Platelets: 210
RBC: 2.32 — ABNORMAL LOW
RBC: 2.51 — ABNORMAL LOW
RBC: 2.62 — ABNORMAL LOW
RBC: 2.75 — ABNORMAL LOW
RBC: 2.79 — ABNORMAL LOW
RBC: 2.9 — ABNORMAL LOW
RBC: 2.92 — ABNORMAL LOW
RBC: 2.97 — ABNORMAL LOW
RBC: 3.06 — ABNORMAL LOW
RBC: 3.68 — ABNORMAL LOW
RBC: 2.65 — ABNORMAL LOW
RBC: 2.82 — ABNORMAL LOW
RDW: 13.4
RDW: 13.5
RDW: 13.9
RDW: 13.9
RDW: 14.1
RDW: 14.4
RDW: 14.5
RDW: 14.7
RDW: 15
RDW: 14.3
RDW: 15.9 — ABNORMAL HIGH
WBC: 10.1
WBC: 11.2 — ABNORMAL HIGH
WBC: 11.7 — ABNORMAL HIGH
WBC: 14.9 — ABNORMAL HIGH
WBC: 19.4 — ABNORMAL HIGH
WBC: 20.4 — ABNORMAL HIGH
WBC: 22.5 — ABNORMAL HIGH
WBC: 25.3 — ABNORMAL HIGH
WBC: 39.8 — ABNORMAL HIGH
WBC: 8.7
WBC: 9.4
WBC: 9.9
WBC: 6
WBC: 6.6

## 2011-09-15 LAB — COMPREHENSIVE METABOLIC PANEL
ALT: 15
AST: 17
Alkaline Phosphatase: 38 — ABNORMAL LOW
Alkaline Phosphatase: 105
BUN: 15
BUN: 3 — ABNORMAL LOW
CO2: 26
Calcium: 7.8 — ABNORMAL LOW
Chloride: 106
Chloride: 112
Chloride: 105
GFR calc Af Amer: >60
GFR calc non Af Amer: 54 — ABNORMAL LOW
Glucose, Bld: 156 — ABNORMAL HIGH
Glucose, Bld: 88
Potassium: 3.5
Potassium: 3.7
Potassium: 3.4 — ABNORMAL LOW
Sodium: 147 — ABNORMAL HIGH
Total Bilirubin: 0.2 — ABNORMAL LOW
Total Bilirubin: 0.5

## 2011-09-15 LAB — BODY FLUID CULTURE
Culture: NO GROWTH
Culture: NO GROWTH
Culture: NO GROWTH

## 2011-09-15 LAB — BASIC METABOLIC PANEL
BUN: 21
BUN: 22
BUN: 4 — ABNORMAL LOW
BUN: 5 — ABNORMAL LOW
BUN: 5 — ABNORMAL LOW
BUN: 3 — ABNORMAL LOW
BUN: 4 — ABNORMAL LOW
CO2: 27
CO2: 28
CO2: 33 — ABNORMAL HIGH
CO2: 34 — ABNORMAL HIGH
Calcium: 7.3 — ABNORMAL LOW
Calcium: 7.7 — ABNORMAL LOW
Calcium: 7.8 — ABNORMAL LOW
Calcium: 7.8 — ABNORMAL LOW
Calcium: 7.8 — ABNORMAL LOW
Calcium: 7.9 — ABNORMAL LOW
Calcium: 8.1 — ABNORMAL LOW
Calcium: 8.1 — ABNORMAL LOW
Calcium: 7.8 — ABNORMAL LOW
Chloride: 102
Chloride: 103
Chloride: 103
Chloride: 104
Chloride: 108
Chloride: 114 — ABNORMAL HIGH
Chloride: 98
Chloride: 99
Creatinine, Ser: 0.5
Creatinine, Ser: 0.64
Creatinine, Ser: 0.66
Creatinine, Ser: 0.7
Creatinine, Ser: 0.79
Creatinine, Ser: 0.86
Creatinine, Ser: 0.51
Creatinine, Ser: 0.61
GFR calc Af Amer: >60
GFR calc Af Amer: >60
GFR calc Af Amer: >60
GFR calc Af Amer: >60
GFR calc Af Amer: >60
GFR calc Af Amer: >60
GFR calc Af Amer: >60
GFR calc Af Amer: >60
GFR calc Af Amer: >60
GFR calc Af Amer: >60
GFR calc non Af Amer: >60
GFR calc non Af Amer: >60
GFR calc non Af Amer: >60
GFR calc non Af Amer: >60
GFR calc non Af Amer: >60
GFR calc non Af Amer: >60
GFR calc non Af Amer: >60
GFR calc non Af Amer: >60
GFR calc non Af Amer: >60
GFR calc non Af Amer: >60
GFR calc non Af Amer: >60
GFR calc non Af Amer: >60
Glucose, Bld: 100 — ABNORMAL HIGH
Glucose, Bld: 103 — ABNORMAL HIGH
Glucose, Bld: 113 — ABNORMAL HIGH
Glucose, Bld: 117 — ABNORMAL HIGH
Glucose, Bld: 202 — ABNORMAL HIGH
Glucose, Bld: 236 — ABNORMAL HIGH
Glucose, Bld: 75
Glucose, Bld: 81
Potassium: 3.4 — ABNORMAL LOW
Potassium: 3.4 — ABNORMAL LOW
Potassium: 3.7
Potassium: 3.8
Potassium: 4
Potassium: 4.4
Potassium: 2.9 — ABNORMAL LOW
Potassium: 3.4 — ABNORMAL LOW
Sodium: 137
Sodium: 137
Sodium: 137
Sodium: 138
Sodium: 138
Sodium: 138
Sodium: 138
Sodium: 139
Sodium: 139
Sodium: 143
Sodium: 146 — ABNORMAL HIGH
Sodium: 138

## 2011-09-15 LAB — CARDIAC PANEL(CRET KIN+CKTOT+MB+TROPI)
CK, MB: 2.8
CK, MB: 3.2
Relative Index: INVALID
Relative Index: INVALID
Total CK: 57
Total CK: 67
Troponin I: 0.06

## 2011-09-15 LAB — POCT I-STAT 3, ART BLOOD GAS (G3+)
Acid-Base Excess: 4 — ABNORMAL HIGH
Acid-Base Excess: 4 — ABNORMAL HIGH
Acid-Base Excess: 5 — ABNORMAL HIGH
Acid-Base Excess: 9 — ABNORMAL HIGH
Bicarbonate: 24.5 — ABNORMAL HIGH
Bicarbonate: 28.2 — ABNORMAL HIGH
Bicarbonate: 29.7 — ABNORMAL HIGH
Bicarbonate: 31.4 — ABNORMAL HIGH
O2 Saturation: 89
O2 Saturation: 98
Operator id: 244861
Operator id: 251151
Operator id: 270161
Operator id: 270161
Operator id: 280991
Operator id: 299431
Patient temperature: 99
Patient temperature: 99.1
TCO2: 30
TCO2: 33
TCO2: 33
pCO2 arterial: 38.3
pCO2 arterial: 39.1
pCO2 arterial: 50.9 — ABNORMAL HIGH
pCO2 arterial: 65.6
pH, Arterial: 7.285 — ABNORMAL LOW
pH, Arterial: 7.402 — ABNORMAL HIGH
pH, Arterial: 7.445 — ABNORMAL HIGH
pH, Arterial: 7.502 — ABNORMAL HIGH
pO2, Arterial: 60 — ABNORMAL LOW
pO2, Arterial: 72 — ABNORMAL LOW
pO2, Arterial: 75 — ABNORMAL LOW
pO2, Arterial: 88

## 2011-09-15 LAB — BLOOD GAS, ARTERIAL
Bicarbonate: 30 — ABNORMAL HIGH
Bicarbonate: 31.6 — ABNORMAL HIGH
Drawn by: 27407
FIO2: 5
O2 Saturation: 93.5
Patient temperature: 98.6
TCO2: 31.3
pCO2 arterial: 40
pCO2 arterial: 42.7
pH, Arterial: 7.461 — ABNORMAL HIGH
pH, Arterial: 7.508 — ABNORMAL HIGH
pO2, Arterial: 63.9 — ABNORMAL LOW

## 2011-09-15 LAB — PHOSPHORUS
Phosphorus: 3.1
Phosphorus: 3.3
Phosphorus: 3.4
Phosphorus: 3.5
Phosphorus: 4.2

## 2011-09-15 LAB — CULTURE, RESPIRATORY W GRAM STAIN

## 2011-09-15 LAB — APTT: aPTT: 24

## 2011-09-15 LAB — PROTIME-INR: INR: 1.1

## 2011-09-15 LAB — FOLATE: Folate: 3.1

## 2011-09-15 LAB — MAGNESIUM
Magnesium: 1.6
Magnesium: 1.6
Magnesium: 1.8
Magnesium: 1.9
Magnesium: 2

## 2011-09-15 LAB — CULTURE, BLOOD (ROUTINE X 2)
Culture: NO GROWTH
Culture: NO GROWTH
Culture: NO GROWTH

## 2011-09-15 LAB — TYPE AND SCREEN
ABO/RH(D): AB NEG
Antibody Screen: NEGATIVE

## 2011-09-15 LAB — CLOSTRIDIUM DIFFICILE EIA: C difficile Toxins A+B, EIA: NEGATIVE

## 2011-09-15 LAB — RETICULOCYTES: Retic Count, Absolute: 30.3

## 2011-09-15 LAB — URINALYSIS, ROUTINE W REFLEX MICROSCOPIC
Bilirubin Urine: NEGATIVE
Ketones, ur: NEGATIVE
Nitrite: NEGATIVE
Protein, ur: 30 — AB
Urobilinogen, UA: 0.2

## 2011-09-15 LAB — EXPECTORATED SPUTUM ASSESSMENT W GRAM STAIN, RFLX TO RESP C

## 2011-09-15 LAB — IRON AND TIBC: TIBC: 132 — ABNORMAL LOW

## 2011-09-15 LAB — URINE MICROSCOPIC-ADD ON

## 2011-09-15 LAB — URINE CULTURE

## 2011-09-18 LAB — APTT
aPTT: 140 — ABNORMAL HIGH
aPTT: 35

## 2011-09-18 LAB — COMPREHENSIVE METABOLIC PANEL
AST: 14
Albumin: 2.4 — ABNORMAL LOW
BUN: 5 — ABNORMAL LOW
Calcium: 8.5
Chloride: 103
Creatinine, Ser: 0.53
GFR calc Af Amer: >60
Total Bilirubin: 0.6
Total Protein: 6.6

## 2011-09-18 LAB — CBC
HCT: 27 — ABNORMAL LOW
HCT: 29.1 — ABNORMAL LOW
HCT: 30.2 — ABNORMAL LOW
HCT: 31.1 — ABNORMAL LOW
HCT: 32.8 — ABNORMAL LOW
Hemoglobin: 10.2 — ABNORMAL LOW
Hemoglobin: 10.5 — ABNORMAL LOW
Hemoglobin: 11.1 — ABNORMAL LOW
Hemoglobin: 9.1 — ABNORMAL LOW
Hemoglobin: 9.4 — ABNORMAL LOW
Hemoglobin: 9.8 — ABNORMAL LOW
Hemoglobin: 9.9 — ABNORMAL LOW
MCHC: 33.2
MCHC: 33.6
MCHC: 33.7
MCHC: 33.7
MCHC: 33.7
MCHC: 33.8
MCV: 94.2
MCV: 94.5
MCV: 95.1
MCV: 95.2
MCV: 95.2
Platelets: 334
Platelets: 479 — ABNORMAL HIGH
Platelets: 514 — ABNORMAL HIGH
RBC: 2.94 — ABNORMAL LOW
RBC: 3.05 — ABNORMAL LOW
RBC: 3.13 — ABNORMAL LOW
RBC: 3.21 — ABNORMAL LOW
RBC: 3.28 — ABNORMAL LOW
RDW: 14.7
RDW: 14.8
RDW: 14.9
RDW: 15.3
RDW: 15.4
RDW: 15.6 — ABNORMAL HIGH
WBC: 11.8 — ABNORMAL HIGH
WBC: 7.2
WBC: 7.5
WBC: 9.5

## 2011-09-18 LAB — HEPARIN LEVEL (UNFRACTIONATED)
Heparin Unfractionated: 0.21 — ABNORMAL LOW
Heparin Unfractionated: 0.43

## 2011-09-18 LAB — LUPUS ANTICOAGULANT PANEL
DRVVT: 58.8 — ABNORMAL HIGH (ref 36.1–47.0)
PTT Lupus Anticoagulant: 63.4 — ABNORMAL HIGH (ref 36.3–48.8)
PTTLA 4:1 Mix: 58.1 — ABNORMAL HIGH (ref 36.3–48.8)
PTTLA Confirmation: 38 — ABNORMAL HIGH (ref <8.0)

## 2011-09-18 LAB — BASIC METABOLIC PANEL
CO2: 23
CO2: 25
CO2: 27
Calcium: 8.1 — ABNORMAL LOW
Calcium: 8.2 — ABNORMAL LOW
Chloride: 104
Chloride: 106
Chloride: 106
Creatinine, Ser: 0.49
Creatinine, Ser: 0.6
GFR calc Af Amer: >60
GFR calc Af Amer: >60
Glucose, Bld: 84
Glucose, Bld: 85
Potassium: 3.5
Sodium: 137
Sodium: 138
Sodium: 141

## 2011-09-18 LAB — DIFFERENTIAL
Basophils Relative: 1
Eosinophils Absolute: 0.2
Eosinophils Relative: 3
Lymphs Abs: 2.6
Monocytes Absolute: 0.3
Monocytes Relative: 4
Neutrophils Relative %: 57

## 2011-09-18 LAB — CULTURE, BLOOD (ROUTINE X 2)
Culture: NO GROWTH
Culture: NO GROWTH

## 2011-09-18 LAB — PROTIME-INR
INR: 1.1
INR: 1.4
INR: 1.7 — ABNORMAL HIGH
INR: 2.4 — ABNORMAL HIGH
Prothrombin Time: 14.5
Prothrombin Time: 20.7 — ABNORMAL HIGH
Prothrombin Time: 26.5 — ABNORMAL HIGH

## 2011-09-18 LAB — URINE MICROSCOPIC-ADD ON

## 2011-09-18 LAB — CARDIAC PANEL(CRET KIN+CKTOT+MB+TROPI)
Relative Index: INVALID
Troponin I: 0.02

## 2011-09-18 LAB — URINALYSIS, ROUTINE W REFLEX MICROSCOPIC
Bilirubin Urine: NEGATIVE
Hgb urine dipstick: NEGATIVE
Nitrite: NEGATIVE
Specific Gravity, Urine: 1.034 — ABNORMAL HIGH
pH: 6

## 2011-09-18 LAB — URINE CULTURE

## 2011-09-18 LAB — MAGNESIUM: Magnesium: 1.9

## 2011-09-18 LAB — ANTITHROMBIN III: AntiThromb III Func: 96

## 2011-09-18 LAB — RAPID URINE DRUG SCREEN, HOSP PERFORMED
Barbiturates: NOT DETECTED
Opiates: POSITIVE — AB

## 2011-09-18 LAB — FACTOR 2 ASSAY: Factor II Activity: 125

## 2012-01-12 ENCOUNTER — Ambulatory Visit (INDEPENDENT_AMBULATORY_CARE_PROVIDER_SITE_OTHER): Payer: Self-pay | Admitting: *Deleted

## 2012-01-12 DIAGNOSIS — R0989 Other specified symptoms and signs involving the circulatory and respiratory systems: Secondary | ICD-10-CM

## 2012-01-12 DIAGNOSIS — Z5181 Encounter for therapeutic drug level monitoring: Secondary | ICD-10-CM

## 2012-01-12 DIAGNOSIS — I2699 Other pulmonary embolism without acute cor pulmonale: Secondary | ICD-10-CM

## 2013-04-24 ENCOUNTER — Encounter (HOSPITAL_COMMUNITY): Payer: Self-pay | Admitting: Emergency Medicine

## 2013-04-24 ENCOUNTER — Emergency Department (HOSPITAL_COMMUNITY): Payer: Medicaid Other

## 2013-04-24 ENCOUNTER — Emergency Department (HOSPITAL_COMMUNITY)
Admission: EM | Admit: 2013-04-24 | Discharge: 2013-04-25 | Disposition: A | Discharge status: Home / Self Care | Payer: Medicaid Other | Attending: Emergency Medicine | Admitting: Emergency Medicine

## 2013-04-24 DIAGNOSIS — Z3202 Encounter for pregnancy test, result negative: Secondary | ICD-10-CM | POA: Insufficient documentation

## 2013-04-24 DIAGNOSIS — Z79899 Other long term (current) drug therapy: Secondary | ICD-10-CM | POA: Insufficient documentation

## 2013-04-24 DIAGNOSIS — I10 Essential (primary) hypertension: Secondary | ICD-10-CM | POA: Insufficient documentation

## 2013-04-24 DIAGNOSIS — Z862 Personal history of diseases of the blood and blood-forming organs and certain disorders involving the immune mechanism: Secondary | ICD-10-CM | POA: Insufficient documentation

## 2013-04-24 DIAGNOSIS — R0789 Other chest pain: Secondary | ICD-10-CM

## 2013-04-24 DIAGNOSIS — J45901 Unspecified asthma with (acute) exacerbation: Secondary | ICD-10-CM | POA: Insufficient documentation

## 2013-04-24 DIAGNOSIS — Z86718 Personal history of other venous thrombosis and embolism: Secondary | ICD-10-CM | POA: Insufficient documentation

## 2013-04-24 DIAGNOSIS — F411 Generalized anxiety disorder: Secondary | ICD-10-CM | POA: Insufficient documentation

## 2013-04-24 DIAGNOSIS — F17200 Nicotine dependence, unspecified, uncomplicated: Secondary | ICD-10-CM | POA: Insufficient documentation

## 2013-04-24 DIAGNOSIS — I2510 Atherosclerotic heart disease of native coronary artery without angina pectoris: Secondary | ICD-10-CM | POA: Insufficient documentation

## 2013-04-24 DIAGNOSIS — R5381 Other malaise: Secondary | ICD-10-CM | POA: Insufficient documentation

## 2013-04-24 DIAGNOSIS — R059 Cough, unspecified: Secondary | ICD-10-CM | POA: Insufficient documentation

## 2013-04-24 DIAGNOSIS — M7989 Other specified soft tissue disorders: Secondary | ICD-10-CM | POA: Insufficient documentation

## 2013-04-24 DIAGNOSIS — R05 Cough: Secondary | ICD-10-CM | POA: Insufficient documentation

## 2013-04-24 DIAGNOSIS — Z7901 Long term (current) use of anticoagulants: Secondary | ICD-10-CM | POA: Insufficient documentation

## 2013-04-24 DIAGNOSIS — D6859 Other primary thrombophilia: Secondary | ICD-10-CM | POA: Insufficient documentation

## 2013-04-24 HISTORY — DX: Activated protein C resistance: D68.51

## 2013-04-24 HISTORY — DX: Deep phlebothrombosis in pregnancy, unspecified trimester: O22.30

## 2013-04-24 HISTORY — DX: Unspecified asthma, uncomplicated: J45.909

## 2013-04-24 LAB — CBC WITH DIFFERENTIAL/PLATELET
Eosinophils Relative: 0 % (ref 0–5)
HCT: 41.9 % (ref 36.0–46.0)
Hemoglobin: 15.1 g/dL — ABNORMAL HIGH (ref 12.0–15.0)
Lymphocytes Relative: 21 % (ref 12–46)
Lymphs Abs: 3 10*3/uL (ref 0.7–4.0)
MCV: 88.8 fL (ref 78.0–100.0)
Monocytes Absolute: 0.7 10*3/uL (ref 0.1–1.0)
Monocytes Relative: 5 % (ref 3–12)
Platelets: 341 10*3/uL (ref 150–400)
RBC: 4.72 MIL/uL (ref 3.87–5.11)
WBC: 14.3 10*3/uL — ABNORMAL HIGH (ref 4.0–10.5)

## 2013-04-24 LAB — COMPREHENSIVE METABOLIC PANEL
ALT: 17 U/L (ref 0–35)
BUN: 6 mg/dL (ref 6–23)
CO2: 24 mEq/L (ref 19–32)
Calcium: 9.5 mg/dL (ref 8.4–10.5)
GFR calc Af Amer: >90 mL/min (ref >90)
GFR calc non Af Amer: >90 mL/min (ref >90)
Glucose, Bld: 205 mg/dL — ABNORMAL HIGH (ref 70–99)
Sodium: 137 mEq/L (ref 135–145)

## 2013-04-24 LAB — URINALYSIS, ROUTINE W REFLEX MICROSCOPIC
Bilirubin Urine: NEGATIVE
Ketones, ur: NEGATIVE mg/dL
Nitrite: NEGATIVE
Specific Gravity, Urine: 1.021 (ref 1.005–1.030)
Urobilinogen, UA: 0.2 mg/dL (ref 0.0–1.0)

## 2013-04-24 LAB — URINE MICROSCOPIC-ADD ON

## 2013-04-24 MED ORDER — OXYCODONE-ACETAMINOPHEN 5-325 MG PO TABS
1.0000 | ORAL_TABLET | Freq: Once | ORAL | Status: AC
  Administered 2013-04-24: 1 via ORAL
  Filled 2013-04-24: qty 1

## 2013-04-24 MED ORDER — HYDROMORPHONE HCL PF 1 MG/ML IJ SOLN
1.0000 mg | Freq: Once | INTRAMUSCULAR | Status: AC
  Administered 2013-04-24: 1 mg via INTRAVENOUS
  Filled 2013-04-24: qty 1

## 2013-04-24 MED ORDER — IOHEXOL 350 MG/ML SOLN: 100.0000 mL | Freq: Once | INTRAVENOUS | Status: AC | PRN

## 2013-04-24 NOTE — ED Notes (Signed)
PT. REPORTS LEFT LATERAL RIBCAGE PAIN WITH SOB , PRODUCTIVE COUGH , PAIN WORSE WITH PALPATION /CERTAIN POSITIONS . DENIES INJURY OR FALL.

## 2013-04-24 NOTE — ED Provider Notes (Addendum)
Complains of left-sided chest pain at left lateral chest onset 2 days ago worse with deep inspiration. Patient has history of DVT in left lower extremity. Denies noncompliance with Coumadin on exam alert no distress lungs clear auscultation heart mildly tachycardic regular rhythm extremities without edema  Doug Sou, MD 04/24/13 2312 Note that after questioning patient after INR returned patient admits to noncompliance with warfarin  Doug Sou, MD 04/25/13 [phone]

## 2013-04-24 NOTE — ED Notes (Signed)
PT states that meds are not effectively relieving pain

## 2013-04-24 NOTE — ED Provider Notes (Signed)
History     CSN: [id number]  Arrival date & time 04/24/13  1901   First MD Initiated Contact with Patient 04/24/13 2108      Chief Complaint  Patient presents with  . Chest Pain    (Consider location/radiation/quality/duration/timing/severity/associated sxs/prior treatment) HPI  Patient is a 37 yo F PMHx significant for Factor V Leiden and DVT currently on Coumadin 5mg  presenting with two days of sharp constant left lateral chest pain w/o radiation that first started after she stretched out her back. Pain is worse with positions, palpation, deep inspiration, and coughing. Pt had been ill with respiratory infection two weeks ago, but still having cough. Pt also has noticed her left left is a little more swollen than normal, but denies erythema or pallor. Patient states her last INR check was last week and was told not to adjust her Coumadin dose. Patient has not been regularly taking her Coumadin.    Past Medical History  Diagnosis Date  . CAD (coronary artery disease)   . Anxiety   . HTN (hypertension)   . DM (diabetes mellitus)   . Anemia   . Asthma   . DVT (deep vein thrombosis) in pregnancy   . Factor V Leiden     History reviewed. No pertinent past surgical history.  No family history on file.  History  Substance Use Topics  . Smoking status: Current Every Day Smoker -- 1.80 packs/day  . Smokeless tobacco: Not on file  . Alcohol Use: No    OB History   Grav Para Term Preterm Abortions TAB SAB Ect Mult Living                  Review of Systems  Constitutional: Positive for fatigue.  HENT: Negative.   Eyes: Negative.   Respiratory: Positive for cough and shortness of breath.   Cardiovascular: Positive for chest pain and leg swelling.  Gastrointestinal: Negative.   Genitourinary: Negative.   Musculoskeletal: Negative.   Skin: Negative.   Neurological: Negative.     Allergies  Review of patient's allergies indicates no known allergies.  Home Medications     Current Outpatient Rx  Name  Route  Sig  Dispense  Refill  . ALPRAZolam (XANAX) 1 MG tablet   Oral   Take 0.5 mg by mouth 3 (three) times daily.          Marland Kitchen buPROPion (WELLBUTRIN SR) 150 MG 12 hr tablet   Oral   Take 150 mg by mouth daily.           Marland Kitchen lisinopril (PRINIVIL,ZESTRIL) 20 MG tablet   Oral   Take 20 mg by mouth daily.           Marland Kitchen oxyCODONE (ROXICODONE) 15 MG immediate release tablet   Oral   Take 30 mg by mouth every 6 (six) hours as needed for pain.         Marland Kitchen oxyCODONE-acetaminophen (PERCOCET) 10-325 MG per tablet   Oral   Take 1 tablet by mouth every 4 (four) hours as needed for pain.         Marland Kitchen warfarin (COUMADIN) 5 MG tablet   Oral   Take 5 mg by mouth daily.           BP 106/83  Pulse 95  Temp(Src) 98.5 F (36.9 C) (Oral)  Resp 15  SpO2 96%  Physical Exam  Constitutional: She is oriented to person, place, and time. She appears well-developed and well-nourished.  HENT:  Head:  Normocephalic and atraumatic.  Mouth/Throat: Oropharynx is clear and moist.  Eyes: Conjunctivae and EOM are normal. Pupils are equal, round, and reactive to light.  Neck: Normal range of motion. Neck supple.  Cardiovascular: Normal rate, regular rhythm and normal heart sounds.   Pulmonary/Chest: Effort normal and breath sounds normal. No respiratory distress. She exhibits tenderness. She exhibits no mass, no edema, no deformity and no swelling.    Abdominal: Soft. Bowel sounds are normal.  Musculoskeletal: She exhibits no edema.  Neurological: She is alert and oriented to person, place, and time.  Skin: Skin is warm and dry. No rash noted. No erythema.  Psychiatric: She has a normal mood and affect.    ED Course  Procedures (including critical care time)  Medications  iohexol (OMNIPAQUE) 350 MG/ML injection 100 mL (not administered)  oxyCODONE-acetaminophen (PERCOCET/ROXICET) 5-325 MG per tablet 1 tablet (1 tablet Oral Given 04/24/13 2157)  HYDROmorphone  (DILAUDID) injection 1 mg (1 mg Intravenous Given 04/24/13 2311)  iohexol (OMNIPAQUE) 350 MG/ML injection 100 mL (100 mLs Intravenous Contrast Given 04/25/13 0010)     Labs Reviewed  CBC WITH DIFFERENTIAL - Abnormal; Notable for the following:    WBC 14.3 (*)    Hemoglobin 15.1 (*)    Neutro Abs 10.5 (*)    All other components within normal limits  COMPREHENSIVE METABOLIC PANEL - Abnormal; Notable for the following:    Glucose, Bld 205 (*)    All other components within normal limits  URINALYSIS, ROUTINE W REFLEX MICROSCOPIC - Abnormal; Notable for the following:    APPearance CLOUDY (*)    Leukocytes, UA LARGE (*)    All other components within normal limits  URINE MICROSCOPIC-ADD ON - Abnormal; Notable for the following:    Squamous Epithelial / LPF MANY (*)    All other components within normal limits  PROTIME-INR  POCT PREGNANCY, URINE  POCT I-STAT TROPONIN I   Dg Ribs Unilateral W/chest Left  04/24/2013  *RADIOLOGY REPORT*  Clinical Data: Left-sided chest pain.  History of pneumonia. Hypertension.  LEFT RIBS AND CHEST - 3+ VIEW  Comparison: 04/22/2010 CT.  Findings: Normal cardiac and mediastinal silhouette.  Clear lung fields.  No bony abnormality.  No effusion or pneumothorax.  No rib fracture.  IMPRESSION: No active cardiopulmonary disease.  No osseous deformity of the rib to explain the patient's pain.   Original Report Authenticated By: Davonna Belling, M.D.    Ct Angio Chest Pe W/cm &/or Wo Cm  04/25/2013  *RADIOLOGY REPORT*  Clinical Data: Left lateral rib pain.  Short of breath.  Productive cough.  CT ANGIOGRAPHY CHEST  Technique:  Multidetector CT imaging of the chest using the standard protocol during bolus administration of intravenous contrast. Multiplanar reconstructed images including MIPs were obtained and reviewed to evaluate the vascular anatomy.  Contrast: OMNIPAQUE IOHEXOL 350 MG/ML SOLN  Comparison: None.  Findings: Technically adequate study.  No pulmonary embolus.   No axillary adenopathy.  No mediastinal or hilar adenopathy.  No pericardial or pleural effusion.  Small focus of air trapping is present in the superior segment of the left lower lobe with hyperlucency.  Scattered other areas of air trapping are present. Bronchial wall thickening is present, prominent in the lower lobes. There is no airspace consolidation.  Incidental imaging of the upper abdomen is within normal limits.  IMPRESSION:  1.  Negative for pulmonary embolus or acute aortic abnormality. 2.  Bronchitis and areas of air trapping/mosaic attenuation compatible with small airways disease.   Original  Report Authenticated By: Andreas Newport, M.D.      1. Atypical chest pain       MDM  Patient is to be discharged with recommendation to follow up with PCP in regards to today's hospital visit. Chest pain is not likely of cardiac or pulmonary etiology d/t presentation, CT angio negative, VSS, no tracheal deviation, no JVD or new murmur, RRR, breath sounds equal bilaterally, EKG without acute abnormalities, negative troponin, and negative CXR. Pt has been advised to return to the ED is CP becomes exertional, associated with diaphoresis or nausea, radiates to left jaw/arm, worsens or becomes concerning in any way. Pt is agreeable to discharge. Pt advised to continue to take all home medications as prescribed, including home pain medication for her chest pain. Patient was advised to continue to take Coumadin as prescribed. Advised to follow up with PCP. Case has been discussed with and seen by Dr. Ethelda Chick who agrees with the above plan to discharge. Patient is stable at time of discharge.            Jeannetta Ellis, PA-C 04/25/13 [phone]

## 2013-04-25 ENCOUNTER — Emergency Department (HOSPITAL_COMMUNITY): Payer: Medicaid Other

## 2013-04-25 ENCOUNTER — Encounter (HOSPITAL_COMMUNITY): Payer: Self-pay | Admitting: Radiology

## 2013-04-25 MED ORDER — WARFARIN - PHYSICIAN DOSING INPATIENT: Freq: Every day | Status: DC

## 2013-04-25 MED ORDER — WARFARIN SODIUM 10 MG PO TABS
10.0000 mg | ORAL_TABLET | Freq: Once | ORAL | Status: AC
  Administered 2013-04-25: 10 mg via ORAL
  Filled 2013-04-25: qty 1

## 2013-04-25 MED ORDER — IOHEXOL 350 MG/ML SOLN
100.0000 mL | Freq: Once | INTRAVENOUS | Status: AC | PRN
  Administered 2013-04-25: 100 mL via INTRAVENOUS

## 2013-04-25 NOTE — ED Provider Notes (Signed)
Medical screening examination/treatment/procedure(s) were conducted as a shared visit with non-physician practitioner(s) and myself.  I personally evaluated the patient during the encounter  Doug Sou, MD 04/25/13 [phone]

## 2013-04-25 NOTE — ED Notes (Signed)
Patient transported to CT 

## 2013-04-25 NOTE — ED Notes (Signed)
Patient is alert and orientedx4.  Patient was explained discharge instructions and they understood them with no questions.  The patient's sister, April Hunt, is taking the patient home.

## 2014-05-28 ENCOUNTER — Encounter: Payer: Medicaid Other | Admitting: Cardiovascular Disease

## 2014-06-02 ENCOUNTER — Telehealth: Payer: Self-pay | Admitting: Cardiovascular Disease

## 2014-06-02 ENCOUNTER — Encounter: Payer: Self-pay | Admitting: Cardiovascular Disease

## 2014-06-02 NOTE — Telephone Encounter (Signed)
Error//Sending note//SR
